# Patient Record
Sex: Male | Born: 1990 | Race: White | Hispanic: No | Marital: Single | Smoking: Current some day smoker
Health system: Southern US, Community
[De-identification: ages and names within clinical notes are randomized; demographics above are authoritative.]

## PROBLEM LIST (undated history)

## (undated) DIAGNOSIS — F909 Attention-deficit hyperactivity disorder, unspecified type: Secondary | ICD-10-CM

## (undated) DIAGNOSIS — K5010 Crohn's disease of large intestine without complications: Secondary | ICD-10-CM

## (undated) HISTORY — PX: WISDOM TOOTH EXTRACTION: SHX21

---

## 2016-09-17 ENCOUNTER — Ambulatory Visit (HOSPITAL_COMMUNITY)
Admission: EM | Admit: 2016-09-17 | Discharge: 2016-09-17 | Disposition: A | Discharge status: Home / Self Care | Payer: Self-pay

## 2016-09-17 ENCOUNTER — Encounter (HOSPITAL_COMMUNITY): Payer: Self-pay | Admitting: Emergency Medicine

## 2016-09-17 DIAGNOSIS — T63444A Toxic effect of venom of bees, undetermined, initial encounter: Secondary | ICD-10-CM

## 2016-09-17 HISTORY — DX: Attention-deficit hyperactivity disorder, unspecified type: F90.9

## 2016-09-17 NOTE — ED Provider Notes (Signed)
[facility]    CSN: [id number] Arrival date & time: 09/17/16  1226     History   Chief Complaint Chief Complaint  Patient presents with  . Insect Bite    HPI Izaak Sahr is a 26 y.o. male.   27 year old male states that he was stung twice by bees 2 days ago. One to the right upper arm and one to the left mid arm. Initially he had some itching and numbness to the mouth however he took Benadryl and that has since abated. The only other complaint is some drainage in the back of the throat. The chief complaint is that of erythema and mild itching surrounding the 2 sting sites.      Past Medical History:  Diagnosis Date  . ADHD     There are no active problems to display for this patient.   Past Surgical History:  Procedure Laterality Date  . WISDOM TOOTH EXTRACTION         Home Medications    Prior to Admission medications   Medication Sig Start Date End Date Taking? Authorizing Provider  diphenhydrAMINE (BENADRYL) 25 MG tablet Take 25 mg by mouth every 6 (six) hours as needed.   Yes [provider]  methylphenidate (RITALIN) 10 MG tablet Take 10 mg by mouth 2 (two) times daily with breakfast and lunch.   Yes [provider]    Family History History reviewed. No pertinent family history.  Social History Social History  Substance Use Topics  . Smoking status: Current Some Day Smoker    Types: Cigars  . Smokeless tobacco: Never Used  . Alcohol use Yes     Allergies   Wasp venom   Review of Systems Review of Systems  Constitutional: Negative.   HENT: Positive for postnasal drip.   Respiratory: Negative.  Negative for cough, shortness of breath, wheezing and stridor.   Cardiovascular: Negative for chest pain.  Gastrointestinal: Negative.   Skin:       As per history of present illness  Neurological: Negative.   All other systems reviewed and are negative.    Physical Exam Triage Vital Signs ED Triage Vitals    Enc Vitals Group     BP 09/17/16 1303 (!) 134/93     Pulse Rate 09/17/16 1303 82     Resp --      Temp 09/17/16 1303 (!) 96.7 F (35.9 C)     Temp Source 09/17/16 1303 Oral     SpO2 09/17/16 1303 99 %     Weight --      Height --      Head Circumference --      Peak Flow --      Pain Score 09/17/16 1305 3     Pain Loc --      Pain Edu? --      Excl. in GC? --    No data found.   Updated Vital Signs BP (!) 134/93 (BP Location: Left Wrist)   Pulse 82   Temp (!) 96.7 F (35.9 C) (Oral)   SpO2 99%   Visual Acuity Right Eye Distance:   Left Eye Distance:   Bilateral Distance:    Right Eye Near:   Left Eye Near:    Bilateral Near:     Physical Exam  Constitutional: He is oriented to person, place, and time. He appears well-developed and well-nourished. No distress.  HENT:  Head: Normocephalic and atraumatic.  Nose: Nose normal.  Mouth/Throat: Oropharynx is  clear and moist. No oropharyngeal exudate.  Eyes: EOM are normal.  Neck: Neck supple.  Cardiovascular: Normal rate, regular rhythm and normal heart sounds.   Pulmonary/Chest: Effort normal and breath sounds normal. No respiratory distress. He has no wheezes. He has no rales.  Musculoskeletal: He exhibits no edema.  Neurological: He is alert and oriented to person, place, and time.  Skin: Skin is warm and dry.  The right upper arm has a well circumscribed area of erythema which is slightly indurated and raised. No red streaks or signs of infection. No drainage. The left arm has a smaller area identical to the right located to the left elbow and portion of the upper arm. No lymphangitis. No circumferential edema.  Psychiatric: He has a normal mood and affect.  Nursing note and vitals reviewed.    UC Treatments / Results  Labs (all labs ordered are listed, but only abnormal results are displayed) Labs Reviewed - No data to display  EKG  EKG Interpretation None       Radiology No results  found.  Procedures Procedures (including critical care time)  Medications Ordered in UC Medications - No data to display   Initial Impression / Assessment and Plan / UC Course  I have reviewed the triage vital signs and the nursing notes.  Pertinent labs & imaging results that were available during my care of the patient were reviewed by me and considered in my medical decision making (see chart for details).    For the localized skin reaction to the bee sting apply cold compresses, hydrocortisone 1% cream 3-4 times a day and Benadryl cream or gel. At nighttime he can take Benadryl orally and during the day he could take Zyrtec or Allegra which are also antihistamines. For new symptoms or problems or worsening may return.    Final Clinical Impressions(s) / UC Diagnoses   Final diagnoses:  Bee sting reaction, undetermined intent, initial encounter    New Prescriptions New Prescriptions   No medications on file     Controlled Substance Prescriptions [place] Controlled Substance Registry consulted? Not Applicable   Hayden Rasmussen, NP 09/17/16 1438

## 2016-09-17 NOTE — Discharge Instructions (Signed)
For the localized skin reaction to the bee sting apply cold compresses, hydrocortisone 1% cream 3-4 times a day and Benadryl cream or gel. At nighttime he can take Benadryl orally and during the day he could take Zyrtec or Allegra which are also antihistamines. For new symptoms or problems or worsening may return.

## 2016-09-17 NOTE — ED Triage Notes (Addendum)
Pt was stung by two wasps on Wednesday.  One on his left elbow and one on his right upper arm.  Pt has redness, heat and swelling to both sites.  Pt reports allergy to wasps and had some swelling of the throat and tongue.  He took Benadryl and that swelling went away.  He reports no symptoms today except at the sting sites.

## 2016-10-11 ENCOUNTER — Encounter (HOSPITAL_COMMUNITY): Payer: Self-pay | Admitting: Emergency Medicine

## 2016-10-11 ENCOUNTER — Ambulatory Visit (HOSPITAL_COMMUNITY)
Admission: EM | Admit: 2016-10-11 | Discharge: 2016-10-11 | Disposition: A | Discharge status: Home / Self Care | Payer: Self-pay | Attending: Family Medicine | Admitting: Family Medicine

## 2016-10-11 DIAGNOSIS — J40 Bronchitis, not specified as acute or chronic: Secondary | ICD-10-CM

## 2016-10-11 DIAGNOSIS — J0100 Acute maxillary sinusitis, unspecified: Secondary | ICD-10-CM

## 2016-10-11 DIAGNOSIS — J069 Acute upper respiratory infection, unspecified: Secondary | ICD-10-CM

## 2016-10-11 MED ORDER — AMOXICILLIN 875 MG PO TABS: 875.0000 mg | ORAL_TABLET | Freq: Two times a day (BID) | ORAL | 0 refills | Status: AC

## 2016-10-11 MED ORDER — PREDNISONE 20 MG PO TABS: ORAL_TABLET | ORAL | 0 refills | Status: DC

## 2016-10-11 NOTE — ED Triage Notes (Signed)
PT reports cough, congestion, sinus pressure for 2 weeks.

## 2016-10-11 NOTE — ED Provider Notes (Signed)
  [facility]   [id number] 10/11/16 Arrival Time: 1016   SUBJECTIVE:  Jesse Yoder is a 26 y.o. male who presents to the urgent care with complaint of cough, congestion, sinus pressure for 2 weeks.   He works for an [employer]. He has a history of Crohn's disease.     Past Medical History:  Diagnosis Date  . ADHD    No family history on file. Social History   Social History  . Marital status: Single    Spouse name: N/A  . Number of children: N/A  . Years of education: N/A   Occupational History  . Not on file.   Social History Main Topics  . Smoking status: Current Some Day Smoker    Types: Cigars  . Smokeless tobacco: Never Used  . Alcohol use Yes  . Drug use: No  . Sexual activity: Not on file   Other Topics Concern  . Not on file   Social History Narrative  . No narrative on file   No outpatient prescriptions have been marked as taking for the 10/11/16 encounter [facility] Encounter).   Allergies  Allergen Reactions  . Wasp Venom Anaphylaxis      ROS: As per HPI, remainder of ROS negative.   OBJECTIVE:   Vitals:   10/11/16 1051  BP: 127/67  Pulse: 75  Resp: 16  Temp: 98.6 F (37 C)  TempSrc: Oral  SpO2: 98%  Weight: 175 lb (79.4 kg)  Height:  (1.702 m)     General appearance: alert; no distress Eyes: PERRL; EOMI; conjunctiva normal HENT: normocephalic; atraumatic; TMs normal, canal normal, external ears normal without trauma; nasal mucosa normal; oral mucosa normal Neck: supple Lungs: ronchi to auscultation bilaterally Heart: regular rate and rhythm Extremities: no cyanosis or edema; symmetrical with no gross deformities Skin: warm and dry Neurologic: normal gait; grossly normal Psychological: alert and cooperative; normal mood and affect      Labs:  No results found for this or any previous visit.  Labs Reviewed - No data to display  No results found.     ASSESSMENT & PLAN:  1.  Bronchitis   2. Upper respiratory tract infection, unspecified type   3. Acute maxillary sinusitis, recurrence not specified     Meds ordered this encounter  Medications  . amoxicillin (AMOXIL) 875 MG tablet    Sig: Take 1 tablet (875 mg total) by mouth 2 (two) times daily.    Dispense:  20 tablet    Refill:  0  . predniSONE (DELTASONE) 20 MG tablet    Sig: Two daily with food    Dispense:  10 tablet    Refill:  0    Reviewed expectations re: course of current medical issues. Questions answered. Outlined signs and symptoms indicating need for more acute intervention. Patient verbalized understanding. After Visit Summary given.    Procedures:      Elvina Sidle, MD 10/11/16 [phone]

## 2018-10-19 ENCOUNTER — Other Ambulatory Visit: Payer: Self-pay

## 2018-10-19 ENCOUNTER — Emergency Department (HOSPITAL_COMMUNITY): Payer: No Typology Code available for payment source

## 2018-10-19 ENCOUNTER — Emergency Department (HOSPITAL_COMMUNITY)
Admission: EM | Admit: 2018-10-19 | Discharge: 2018-10-19 | Disposition: A | Discharge status: Home / Self Care | Payer: No Typology Code available for payment source | Attending: Emergency Medicine | Admitting: Emergency Medicine

## 2018-10-19 ENCOUNTER — Encounter (HOSPITAL_COMMUNITY): Payer: Self-pay | Admitting: Emergency Medicine

## 2018-10-19 DIAGNOSIS — Y999 Unspecified external cause status: Secondary | ICD-10-CM | POA: Insufficient documentation

## 2018-10-19 DIAGNOSIS — F17210 Nicotine dependence, cigarettes, uncomplicated: Secondary | ICD-10-CM | POA: Insufficient documentation

## 2018-10-19 DIAGNOSIS — R51 Headache: Secondary | ICD-10-CM | POA: Diagnosis not present

## 2018-10-19 DIAGNOSIS — R0789 Other chest pain: Secondary | ICD-10-CM | POA: Insufficient documentation

## 2018-10-19 DIAGNOSIS — Y93I9 Activity, other involving external motion: Secondary | ICD-10-CM | POA: Diagnosis not present

## 2018-10-19 DIAGNOSIS — M25511 Pain in right shoulder: Secondary | ICD-10-CM | POA: Diagnosis not present

## 2018-10-19 DIAGNOSIS — Y92414 Local residential or business street as the place of occurrence of the external cause: Secondary | ICD-10-CM | POA: Insufficient documentation

## 2018-10-19 HISTORY — DX: Crohn's disease of large intestine without complications: K50.10

## 2018-10-19 MED ORDER — IBUPROFEN 400 MG PO TABS
600.0000 mg | ORAL_TABLET | Freq: Once | ORAL | Status: AC
  Administered 2018-10-19: 22:00:00 600 mg via ORAL
  Filled 2018-10-19: qty 1

## 2018-10-19 MED ORDER — CYCLOBENZAPRINE HCL 10 MG PO TABS: 10.0000 mg | ORAL_TABLET | Freq: Two times a day (BID) | ORAL | 0 refills | Status: AC | PRN

## 2018-10-19 NOTE — ED Notes (Signed)
Patient Alert and oriented to baseline. Stable and ambulatory to baseline. Patient verbalized understanding of the discharge instructions.  Patient belongings were taken by the patient.   
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
## 2018-10-19 NOTE — Discharge Instructions (Addendum)
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
## 2023-04-09 ENCOUNTER — Emergency Department (HOSPITAL_COMMUNITY): Payer: Self-pay

## 2023-04-09 ENCOUNTER — Ambulatory Visit (HOSPITAL_COMMUNITY)
Admission: EM | Admit: 2023-04-09 | Discharge: 2023-04-09 | Disposition: A | Discharge status: Home / Self Care | Payer: Self-pay

## 2023-04-09 ENCOUNTER — Emergency Department (HOSPITAL_COMMUNITY)
Admission: EM | Admit: 2023-04-09 | Discharge: 2023-04-09 | Disposition: A | Discharge status: Home / Self Care | Payer: Self-pay | Attending: Emergency Medicine | Admitting: Emergency Medicine

## 2023-04-09 ENCOUNTER — Other Ambulatory Visit: Payer: Self-pay

## 2023-04-09 ENCOUNTER — Encounter (HOSPITAL_COMMUNITY): Payer: Self-pay | Admitting: *Deleted

## 2023-04-09 DIAGNOSIS — R42 Dizziness and giddiness: Secondary | ICD-10-CM

## 2023-04-09 DIAGNOSIS — I10 Essential (primary) hypertension: Secondary | ICD-10-CM

## 2023-04-09 DIAGNOSIS — R079 Chest pain, unspecified: Secondary | ICD-10-CM | POA: Insufficient documentation

## 2023-04-09 LAB — BASIC METABOLIC PANEL
Anion gap: 15 (ref 5–15)
BUN: 11 mg/dL (ref 6–20)
CO2: 22 mmol/L (ref 22–32)
Calcium: 9.6 mg/dL (ref 8.9–10.3)
Chloride: 103 mmol/L (ref 98–111)
Creatinine, Ser: 1.09 mg/dL (ref 0.61–1.24)
GFR, Estimated: >60 mL/min (ref >60)
Glucose, Bld: 104 mg/dL — ABNORMAL HIGH (ref 70–99)
Potassium: 4.3 mmol/L (ref 3.5–5.1)
Sodium: 140 mmol/L (ref 135–145)

## 2023-04-09 LAB — CBC
HCT: 47.3 % (ref 39.0–52.0)
Hemoglobin: 16.1 g/dL (ref 13.0–17.0)
MCH: 29.7 pg (ref 26.0–34.0)
MCHC: 34 g/dL (ref 30.0–36.0)
MCV: 87.1 fL (ref 80.0–100.0)
Platelets: 345 10*3/uL (ref 150–400)
RBC: 5.43 MIL/uL (ref 4.22–5.81)
RDW: 12.4 % (ref 11.5–15.5)
WBC: 10.7 10*3/uL — ABNORMAL HIGH (ref 4.0–10.5)
nRBC: 0 % (ref 0.0–0.2)

## 2023-04-09 LAB — D-DIMER, QUANTITATIVE: D-Dimer, Quant: <0.27 ug{FEU}/mL (ref 0.00–0.50)

## 2023-04-09 LAB — TROPONIN I (HIGH SENSITIVITY)
Troponin I (High Sensitivity): <2 ng/L (ref <18)
Troponin I (High Sensitivity): <2 ng/L (ref <18)

## 2023-04-09 MED ORDER — HYDROXYZINE HCL 25 MG PO TABS: 25.0000 mg | ORAL_TABLET | Freq: Four times a day (QID) | ORAL | 0 refills | Status: DC

## 2023-04-09 MED ORDER — ALUM & MAG HYDROXIDE-SIMETH 200-200-20 MG/5ML PO SUSP
30.0000 mL | Freq: Once | ORAL | Status: AC
  Administered 2023-04-09: 30 mL via ORAL
  Filled 2023-04-09: qty 30

## 2023-04-09 NOTE — ED Notes (Signed)
 Pt states when he takes a deep breath his upper left chest feels tight and pressure. Pt says its been going on for a week but the last few days he hasn't been able to complete tasks without catching his breath.  [place] He denies hx cardiac and blood clot hx

## 2023-04-09 NOTE — ED Triage Notes (Signed)
 PT reports high BP at home 160/120 yesterday. Pt has felt dizzy ,

## 2023-04-09 NOTE — ED Notes (Signed)
Provider at bed side to eval PT 

## 2023-04-09 NOTE — ED Provider Notes (Signed)
 [facility]    CSN: [id number] Arrival date & time: 04/09/23  1615      History   Chief Complaint Chief Complaint  Patient presents with   Hypertension    HPI Jesse Yoder is a 33 y.o. male.   Patient presents with left-sided chest pain and shortness of breath that began last night.  Patient states that chest pain has been constant since last night.  Patient states that he has also had some intermittent dizziness and right arm numbness throughout the day today.  Patient states that over the last month he has noticed that his blood pressure has been elevated.  Patient denies taking any blood pressure medication regularly or history of diagnosed hypertension.  Patient states that he does have ADHD and is prescribed methylphenidate which he stopped abruptly on Tuesday.  Patient states that he stopped taking this because he thought it may be contributing to his high blood pressure.   Hypertension    Past Medical History:  Diagnosis Date   ADHD    Crohn's colitis (HCC)     There are no active problems to display for this patient.   Past Surgical History:  Procedure Laterality Date   WISDOM TOOTH EXTRACTION         Home Medications    Prior to Admission medications   Medication Sig Start Date End Date Taking? Authorizing Provider  methylphenidate (METADATE ER) 20 MG ER tablet Take 20 mg by mouth daily. New dose   Yes [provider]  traZODone (DESYREL) 50 MG tablet Take 50 mg by mouth at bedtime.   Yes [provider]  amoxicillin (AMOXIL) 875 MG tablet Take 1 tablet (875 mg total) by mouth 2 (two) times daily. 10/11/16   Elvina Sidle, MD  cyclobenzaprine (FLEXERIL) 10 MG tablet Take 1 tablet (10 mg total) by mouth 2 (two) times daily as needed for muscle spasms. 10/19/18   Robinson, [place] N, PA-C  methylphenidate (RITALIN) 10 MG tablet Take 10 mg by mouth 2 (two) times daily with breakfast and lunch.    [provider]  predniSONE (DELTASONE) 20 MG tablet Two daily with food 10/11/16   Elvina Sidle, MD    Family History History reviewed. No pertinent family history.  Social History Social History   Tobacco Use   Smoking status: Some Days    Types: Cigars   Smokeless tobacco: Never  Vaping Use   Vaping status: Never Used  Substance Use Topics   Alcohol use: Yes   Drug use: No     Allergies   Wasp venom   Review of Systems Review of Systems  Per HPI  Physical Exam Triage Vital Signs ED Triage Vitals  Encounter Vitals Group     BP 04/09/23 1635 (!) 143/106     Systolic BP Percentile --      Diastolic BP Percentile --      Pulse Rate 04/09/23 1635 (!) 106     Resp 04/09/23 1635 20     Temp 04/09/23 1635 98.4 F (36.9 C)     Temp src --      SpO2 04/09/23 1635 100 %     Weight --      Height --      Head Circumference --      Peak Flow --      Pain Score 04/09/23 1656 0     Pain Loc --      Pain Education --  Exclude from Growth Chart --    No data found.  Updated Vital Signs BP (!) 143/106   Pulse (!) 106   Temp 98.4 F (36.9 C)   Resp 20   SpO2 100%   Visual Acuity Right Eye Distance:   Left Eye Distance:   Bilateral Distance:    Right Eye Near:   Left Eye Near:    Bilateral Near:     Physical Exam Vitals and nursing note reviewed.  Constitutional:      General: He is awake. He is not in acute distress.    Appearance: Normal appearance. He is well-developed and well-groomed. He is not ill-appearing.  Cardiovascular:     Rate and Rhythm: Regular rhythm. Tachycardia present.  Pulmonary:     Effort: Pulmonary effort is normal.     Breath sounds: Normal breath sounds.  Musculoskeletal:        General: Normal range of motion.  Skin:    General: Skin is warm and dry.  Neurological:     General: No focal deficit present.     Mental Status: He is alert and oriented to person, place, and time. Mental status is at baseline.     GCS: GCS eye  subscore is 4. GCS verbal subscore is 5. GCS motor subscore is 6.  Psychiatric:        Behavior: Behavior is cooperative.      UC Treatments / Results  Labs (all labs ordered are listed, but only abnormal results are displayed) Labs Reviewed - No data to display  EKG   Radiology No results found.  Procedures Procedures (including critical care time)  Medications Ordered in UC Medications - No data to display  Initial Impression / Assessment and Plan / UC Course  I have reviewed the triage vital signs and the nursing notes.  Pertinent labs & imaging results that were available during my care of the patient were reviewed by me and considered in my medical decision making (see chart for details).     Mild tachycardia is present upon exam.  No other significant findings upon exam.  GCS 15.  EKG revealed normal sinus rhythm without ST elevation, depression, or acute cardiac findings.  Recommended that patient be seen in ER due to increased blood pressure and new onset of chest pain and dizziness.  Patient agreeable to plan at this time.  Patient is stable to arrive to the ER via POV. Final Clinical Impressions(s) / UC Diagnoses   Final diagnoses:  Hypertension, unspecified type  Chest pain, unspecified type  Dizziness     Discharge Instructions      Go to the ER for further evaluation of your chest pain and elevated blood pressure.   ED Prescriptions   None    PDMP not reviewed this encounter.   Wynonia Lawman A, NP 04/09/23 [phone]

## 2023-04-09 NOTE — Discharge Instructions (Signed)
 Go to the ER for further evaluation of your chest pain and elevated blood pressure.

## 2023-04-09 NOTE — Discharge Instructions (Addendum)
 All the blood work looked good and no signs of enlarged heart on x-ray.  No signs of heart attack or blood clot.  May be related to stress and not sleeping.  Try the hydroxyzine for the stress and you may need to increase your dose of trazadone.  You were given mental health resources and numbers for people who may be able to help you with that.  Also you will need to follow up if you continue to have pain.  If you start passing out, rapid heart rate pain is moving return to the ER.

## 2023-04-09 NOTE — ED Triage Notes (Signed)
 The pt has had chest pain and sob for 2-3 days    the pt has no previous history  he works with heavy equipment

## 2023-04-10 NOTE — ED Provider Notes (Signed)
 [place] [facility] Provider Note   CSN: [id number] Arrival date & time: 04/09/23  1703     History  Chief Complaint  Patient presents with   Chest Pain    Jesse Yoder is a 33 y.o. male.  Patient is a 33 year old male with a history of ADHD and difficulty sleeping who is taking trazodone at night who is presenting today with complaints of chest discomfort.  He reports that for the last week he has been noticing a tightness in his chest that initially was just noticeable but has been more uncomfortable in the last few days.  He reports the pain has not gone away at all in the last week but it gets worse when he tries to work.  It also feels tighter when he takes a deep breath.  He has not had a cough, congestion or fever.  Lying down does not seem to make his symptoms any worse.  He has not had any swelling in his lower extremities.  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  He does not smoke and has no history of asthma.  He has never used an inhaler.  He does report that he has been under a lot of stress lately and he is not sleeping.  He has not had any reflux symptoms.  Also patient went to urgent care prior to this describing his symptoms.  There he was found to be hypertensive without history of hypertension but reports when he has been checking at home his blood pressure has been running high for the last week or so.  The history is provided by the patient and medical records.  Chest Pain      Home Medications Prior to Admission medications   Medication Sig Start Date End Date Taking? Authorizing Provider  hydrOXYzine (ATARAX) 25 MG tablet Take 1 tablet (25 mg total) by mouth every 6 (six) hours. 04/09/23  Yes Gwyneth Sprout, MD  amoxicillin (AMOXIL) 875 MG tablet Take 1 tablet (875 mg total) by mouth 2 (two) times daily. 10/11/16   Elvina Sidle, MD  cyclobenzaprine  (FLEXERIL) 10 MG tablet Take 1 tablet (10 mg total) by mouth 2 (two) times daily as needed for muscle spasms. 10/19/18   Robinson, [place] N, PA-C  methylphenidate (METADATE ER) 20 MG ER tablet Take 20 mg by mouth daily. New dose    [provider]  methylphenidate (RITALIN) 10 MG tablet Take 10 mg by mouth 2 (two) times daily with breakfast and lunch.    [provider]  predniSONE (DELTASONE) 20 MG tablet Two daily with food 10/11/16   Elvina Sidle, MD  traZODone (DESYREL) 50 MG tablet Take 50 mg by mouth at bedtime.    [provider]      Allergies    Wasp venom    Review of Systems   Review of Systems  Cardiovascular:  Positive for chest pain.    Physical Exam Updated Vital Signs BP 131/85 (BP Location: Right Arm)   Pulse 83   Temp 98.7 F (37.1 C) (Oral)   Resp 18   Ht 5\' 7"  (1.702 m)   Wt 86.2 kg   SpO2 97%   BMI 29.76 kg/m  Physical Exam Vitals and nursing note reviewed.  Constitutional:      General: He is not in acute distress.    Appearance: He is well-developed.  HENT:     Head:  Normocephalic and atraumatic.  Eyes:     Conjunctiva/sclera: Conjunctivae normal.     Pupils: Pupils are equal, round, and reactive to light.  Cardiovascular:     Rate and Rhythm: Normal rate and regular rhythm.     Heart sounds: No murmur heard. Pulmonary:     Effort: Pulmonary effort is normal. No respiratory distress.     Breath sounds: Normal breath sounds. No wheezing or rales.  Chest:     Chest wall: No tenderness.  Abdominal:     General: There is no distension.     Palpations: Abdomen is soft.     Tenderness: There is no abdominal tenderness. There is no guarding or rebound.  Musculoskeletal:        General: No tenderness. Normal range of motion.     Cervical back: Normal range of motion and neck supple.     Right lower leg: No edema.     Left lower leg: No edema.  Skin:    General: Skin is warm and dry.     Findings: No erythema or rash.   Neurological:     Mental Status: He is alert and oriented to person, place, and time. Mental status is at baseline.  Psychiatric:        Mood and Affect: Mood normal.        Behavior: Behavior normal.     ED Results / Procedures / Treatments   Labs (all labs ordered are listed, but only abnormal results are displayed) Labs Reviewed  BASIC METABOLIC PANEL - Abnormal; Notable for the following components:      Result Value   Glucose, Bld 104 (*)    All other components within normal limits  CBC - Abnormal; Notable for the following components:   WBC 10.7 (*)    All other components within normal limits  D-DIMER, QUANTITATIVE  TROPONIN I (HIGH SENSITIVITY)  TROPONIN I (HIGH SENSITIVITY)    EKG EKG Interpretation Date/Time:  Saturday April 09 2023 20:27:58 EDT Ventricular Rate:  77 PR Interval:  126 QRS Duration:  82 QT Interval:  378 QTC Calculation: 427 R Axis:   80  Text Interpretation: Normal sinus rhythm Cannot rule out Anterior infarct , age undetermined No significant change since last tracing When compared with ECG of 09-Apr-2023 17:13, PREVIOUS ECG IS PRESENT Confirmed by Gwyneth Sprout ([id number]) on 04/09/2023 9:00:04 PM  Radiology DG Chest 2 View Result Date: 04/09/2023 CLINICAL DATA:  Chest pain shortness of breath for 2 days. EXAM: CHEST - 2 VIEW COMPARISON:  10/19/2018. FINDINGS: Normal heart, mediastinum and hila. Lungs are clear.  No pleural effusion or pneumothorax. Skeletal structures are within normal limits. IMPRESSION: Normal chest radiographs. Electronically Signed   By: Amie Portland M.D.   On: 04/09/2023 17:52    Procedures Procedures    Medications Ordered in ED Medications  alum & mag hydroxide-simeth (MAALOX/MYLANTA) 200-200-20 MG/5ML suspension 30 mL (30 mLs Oral Given 04/09/23 2232)    ED Course/ Medical Decision Making/ A&P                                 Medical Decision Making Amount and/or Complexity of Data Reviewed External Data  Reviewed: notes. Labs: ordered. Decision-making details documented in ED Course. Radiology: ordered and independent interpretation performed. Decision-making details documented in ED Course. ECG/medicine tests: ordered and independent interpretation performed. Decision-making details documented in ED Course.  Risk OTC drugs. Prescription drug management.  Pt presenting today with a complaint that caries a high risk for morbidity and mortality.  Here today with complaint of nonspecific chest tightness.  Reports it is worse with exertion.  No wheezing on exam.  Initially reported at urgent care that his blood pressure was high however without intervention blood pressure has been normal here.  Slightly tachycardic prior to arrival but with rest tachycardia has also resolved.  Patient reports a family history of blood clots and multiple family members but no significant cardiac history.  He does not use drugs, excessive alcohol or tobacco.  Will rule out PE, ACS, symptoms are not classic for pneumonia, myocarditis, pericardial effusion/tamponade.  Patient has pulses in all extremities and low suspicion for dissection at this time.  Patient does report being under an extreme amount of stress lately and is not sleeping.  He has stopped taking his Adderall but continues to try taking his trazodone but reports he still not sleeping. I independently interpreted patient's labs and EKG.  EKG without acute findings today.  No ST elevation concerning for MI.  D-dimer, troponin x 2, BMP are all within normal limits.  I have independently visualized and interpreted pt's images today. Chest x-ray within normal limits.  Findings discussed with the patient.  At this time patient does not have a PCP and TOC was consulted to contact the patient to help with PCP as he does not have insurance.  He was also given mental health resources to follow-up about the stress and sleep.  He currently is just getting prescriptions over  the phone.  He was given a prescription for hydroxyzine.  He was also given return precautions.         Final Clinical Impression(s) / ED Diagnoses Final diagnoses:  Nonspecific chest pain    Rx / DC Orders ED Discharge Orders          Ordered    hydrOXYzine (ATARAX) 25 MG tablet  Every 6 hours        04/09/23 2306              Gwyneth Sprout, MD 04/10/23 [phone]

## 2023-08-07 ENCOUNTER — Encounter (HOSPITAL_COMMUNITY): Payer: Self-pay

## 2023-08-07 ENCOUNTER — Emergency Department (HOSPITAL_COMMUNITY)
Admission: EM | Admit: 2023-08-07 | Discharge: 2023-08-07 | Disposition: A | Discharge status: Home / Self Care | Payer: Self-pay | Attending: Emergency Medicine | Admitting: Emergency Medicine

## 2023-08-07 ENCOUNTER — Other Ambulatory Visit: Payer: Self-pay

## 2023-08-07 DIAGNOSIS — T782XXA Anaphylactic shock, unspecified, initial encounter: Secondary | ICD-10-CM | POA: Insufficient documentation

## 2023-08-07 DIAGNOSIS — T63441A Toxic effect of venom of bees, accidental (unintentional), initial encounter: Secondary | ICD-10-CM | POA: Insufficient documentation

## 2023-08-07 DIAGNOSIS — F17290 Nicotine dependence, other tobacco product, uncomplicated: Secondary | ICD-10-CM | POA: Insufficient documentation

## 2023-08-07 LAB — CBC WITH DIFFERENTIAL/PLATELET
Abs Immature Granulocytes: 0.01 K/uL (ref 0.00–0.07)
Basophils Absolute: 0.1 K/uL (ref 0.0–0.1)
Basophils Relative: 1 %
Eosinophils Absolute: 0.3 K/uL (ref 0.0–0.5)
Eosinophils Relative: 4 %
HCT: 48.5 % (ref 39.0–52.0)
Hemoglobin: 16.4 g/dL (ref 13.0–17.0)
Immature Granulocytes: 0 %
Lymphocytes Relative: 47 %
Lymphs Abs: 3.3 K/uL (ref 0.7–4.0)
MCH: 30.3 pg (ref 26.0–34.0)
MCHC: 33.8 g/dL (ref 30.0–36.0)
MCV: 89.5 fL (ref 80.0–100.0)
Monocytes Absolute: 0.5 K/uL (ref 0.1–1.0)
Monocytes Relative: 7 %
Neutro Abs: 3 K/uL (ref 1.7–7.7)
Neutrophils Relative %: 41 %
Platelets: 349 K/uL (ref 150–400)
RBC: 5.42 MIL/uL (ref 4.22–5.81)
RDW: 13.2 % (ref 11.5–15.5)
WBC: 7.2 K/uL (ref 4.0–10.5)
nRBC: 0 % (ref 0.0–0.2)

## 2023-08-07 LAB — BASIC METABOLIC PANEL WITH GFR
Anion gap: 10 (ref 5–15)
BUN: 16 mg/dL (ref 6–20)
CO2: 23 mmol/L (ref 22–32)
Calcium: 9.1 mg/dL (ref 8.9–10.3)
Chloride: 106 mmol/L (ref 98–111)
Creatinine, Ser: 1.1 mg/dL (ref 0.61–1.24)
GFR, Estimated: >60 mL/min (ref >60)
Glucose, Bld: 153 mg/dL — ABNORMAL HIGH (ref 70–99)
Potassium: 3.7 mmol/L (ref 3.5–5.1)
Sodium: 139 mmol/L (ref 135–145)

## 2023-08-07 MED ORDER — DIPHENHYDRAMINE HCL 50 MG/ML IJ SOLN
25.0000 mg | Freq: Once | INTRAMUSCULAR | Status: AC
  Administered 2023-08-07: 25 mg via INTRAVENOUS
  Filled 2023-08-07: qty 1

## 2023-08-07 MED ORDER — PREDNISONE 10 MG (21) PO TBPK: ORAL_TABLET | Freq: Every day | ORAL | 0 refills | Status: AC

## 2023-08-07 MED ORDER — EPINEPHRINE 0.3 MG/0.3ML IJ SOAJ: 0.3000 mg | INTRAMUSCULAR | 0 refills | Status: AC | PRN

## 2023-08-07 MED ORDER — EPINEPHRINE 0.3 MG/0.3ML IJ SOAJ: 0.3000 mg | Freq: Once | INTRAMUSCULAR | Status: AC

## 2023-08-07 MED ORDER — EPINEPHRINE 0.3 MG/0.3ML IJ SOAJ
INTRAMUSCULAR | Status: AC
  Administered 2023-08-07: 0.3 mg via INTRAMUSCULAR
  Filled 2023-08-07: qty 0.3

## 2023-08-07 MED ORDER — FAMOTIDINE IN NACL 20-0.9 MG/50ML-% IV SOLN
20.0000 mg | Freq: Once | INTRAVENOUS | Status: AC
  Administered 2023-08-07: 20 mg via INTRAVENOUS
  Filled 2023-08-07: qty 50

## 2023-08-07 MED ORDER — CETIRIZINE HCL 10 MG PO TABS: 10.0000 mg | ORAL_TABLET | Freq: Every day | ORAL | 0 refills | Status: AC

## 2023-08-07 MED ORDER — METHYLPREDNISOLONE SODIUM SUCC 125 MG IJ SOLR
125.0000 mg | Freq: Once | INTRAMUSCULAR | Status: AC
  Administered 2023-08-07: 125 mg via INTRAVENOUS
  Filled 2023-08-07: qty 2

## 2023-08-07 MED ORDER — SODIUM CHLORIDE 0.9 % IV BOLUS
1000.0000 mL | Freq: Once | INTRAVENOUS | Status: AC
  Administered 2023-08-07: 1000 mL via INTRAVENOUS

## 2023-08-07 NOTE — ED Triage Notes (Signed)
 Patient was stuck by a bee on his left ankle. He said his palms started to itch, testicles began itching, lips swelling, ears swelling, face swelling. Hives all over body. Dr. Elnor at bedside.

## 2023-08-07 NOTE — ED Provider Notes (Signed)
 [place] EMERGENCY DEPARTMENT AT [facility] Provider Note  CSN: [id number] Arrival date & time: 08/07/23 9050  Chief Complaint(s) Allergic Reaction  HPI Jesse Yoder is a 33 y.o. male with past medical history as below, significant for ADHD, colitis who presents to the ED with complaint of bee sting  Patient reports just prior to arrival he was stung by some sort of insect on the ground.  He experienced diffuse hives, facial swelling, difficulty breathing and swallowing, took 25 mg benadryl  pta.   He denies any known allergies.  Denies recent medication or diet changes.  Reports state of health prior to onset of symptoms   Past Medical History Past Medical History:  Diagnosis Date   ADHD    Crohn's colitis (HCC)    There are no active problems to display for this patient.  Home Medication(s) Prior to Admission medications   Medication Sig Start Date End Date Taking? Authorizing Provider  amphetamine-dextroamphetamine (ADDERALL XR) 20 MG 24 hr capsule Take 20 mg by mouth daily.   Yes [provider]  amphetamine-dextroamphetamine (ADDERALL) 10 MG tablet Take 10 mg by mouth daily with breakfast.   Yes [provider]  cetirizine  (ZYRTEC  ALLERGY) 10 MG tablet Take 1 tablet (10 mg total) by mouth daily for 14 days. 08/07/23 08/21/23 Yes Elnor Savant A, DO  EPINEPHrine  0.3 mg/0.3 mL IJ SOAJ injection Inject 0.3 mg into the muscle as needed for anaphylaxis. 08/07/23  Yes Elnor Savant LABOR, DO  predniSONE  (STERAPRED UNI-PAK 21 TAB) 10 MG (21) TBPK tablet Take by mouth daily. Take 6 tabs by mouth daily  for 2 days, then 5 tabs for 2 days, then 4 tabs for 2 days, then 3 tabs for 2 days, 2 tabs for 2 days, then 1 tab by mouth daily for 2 days 08/07/23  Yes Elnor Savant A, DO  traZODone (DESYREL) 50 MG tablet Take 50 mg by mouth at bedtime.   Yes [provider]  amoxicillin  (AMOXIL ) 875 MG tablet Take 1 tablet (875 mg total) by mouth 2 (two) times  daily. Patient not taking: Reported on 08/07/2023 10/11/16   Mario Million, MD  cyclobenzaprine  (FLEXERIL ) 10 MG tablet Take 1 tablet (10 mg total) by mouth 2 (two) times daily as needed for muscle spasms. Patient not taking: Reported on 08/07/2023 10/19/18   Robinson, [place] N, PA-C  methylphenidate (METADATE ER) 20 MG ER tablet Take 20 mg by mouth daily. New dose Patient not taking: Reported on 08/07/2023    [provider]  methylphenidate (RITALIN) 10 MG tablet Take 10 mg by mouth 2 (two) times daily with breakfast and lunch. Patient not taking: Reported on 08/07/2023    [provider]                                                                                                                                    Past Surgical History Past Surgical History:  Procedure Laterality Date   WISDOM TOOTH EXTRACTION     Family History History reviewed. No pertinent family history.  Social History Social History   Tobacco Use   Smoking status: Some Days    Types: Cigars   Smokeless tobacco: Never  Vaping Use   Vaping status: Never Used  Substance Use Topics   Alcohol use: Yes   Drug use: No   Allergies Wasp venom, Bee venom, and Hops oil  Review of Systems A thorough review of systems was obtained and all systems are negative except as noted in the HPI and PMH.   Physical Exam Vital Signs  I have reviewed the triage vital signs BP 125/77   Pulse 82   Temp 98 F (36.7 C) (Oral)   Resp (!) 21   Ht 5' 7 (1.702 m)   Wt 88 kg   SpO2 97%   BMI 30.39 kg/m  Physical Exam Vitals and nursing note reviewed.  Constitutional:      General: He is in acute distress.     Appearance: He is well-developed.  HENT:     Head: Normocephalic and atraumatic.     Jaw: There is normal jaw occlusion.     Comments: Facial swelling noted     Right Ear: External ear normal.     Left Ear: External ear normal.     Mouth/Throat:     Mouth: Mucous membranes are moist.      Pharynx: Oropharynx is clear. Uvula midline.     Comments: No drooling stridor or trismus Eyes:     General: No scleral icterus. Cardiovascular:     Rate and Rhythm: Normal rate and regular rhythm.     Pulses: Normal pulses.     Heart sounds: Normal heart sounds.  Pulmonary:     Effort: Pulmonary effort is normal. No respiratory distress.     Breath sounds: Normal breath sounds.  Abdominal:     General: Abdomen is flat.     Palpations: Abdomen is soft.     Tenderness: There is no abdominal tenderness.  Musculoskeletal:     Cervical back: No rigidity.     Right lower leg: No edema.     Left lower leg: No edema.  Skin:    General: Skin is warm and dry.     Capillary Refill: Capillary refill takes less than 2 seconds.     Comments: Diffuse urticaria  Neurological:     Mental Status: He is alert.  Psychiatric:        Mood and Affect: Mood normal.        Behavior: Behavior normal.     ED Results and Treatments Labs (all labs ordered are listed, but only abnormal results are displayed) Labs Reviewed  BASIC METABOLIC PANEL WITH GFR - Abnormal; Notable for the following components:      Result Value   Glucose, Bld 153 (*)    All other components within normal limits  CBC WITH DIFFERENTIAL/PLATELET  Radiology No results found.  Pertinent labs & imaging results that were available during my care of the patient were reviewed by me and considered in my medical decision making (see MDM for details).  Medications Ordered in ED Medications  sodium chloride  0.9 % bolus 1,000 mL (0 mLs Intravenous Stopped 08/07/23 1205)  diphenhydrAMINE  (BENADRYL ) injection 25 mg (25 mg Intravenous Given 08/07/23 1017)  famotidine  (PEPCID ) IVPB 20 mg premix (0 mg Intravenous Stopped 08/07/23 1205)  methylPREDNISolone  sodium succinate (SOLU-MEDROL ) 125 mg/2 mL injection 125 mg (125 mg  Intravenous Given 08/07/23 1017)  EPINEPHrine  (EPI-PEN) injection 0.3 mg (0.3 mg Intramuscular Given 08/07/23 1007)                                                                                                                                     Procedures .Critical Care  Performed by: Elnor Jayson LABOR, DO Authorized by: Elnor Jayson LABOR, DO   Critical care provider statement:    Critical care time (minutes):  30   Critical care time was exclusive of:  Separately billable procedures and treating other patients   Critical care was necessary to treat or prevent imminent or life-threatening deterioration of the following conditions: anaphylaxis.   Critical care was time spent personally by me on the following activities:  Development of treatment plan with patient or surrogate, discussions with consultants, evaluation of patient's response to treatment, examination of patient, ordering and review of laboratory studies, ordering and review of radiographic studies, ordering and performing treatments and interventions, pulse oximetry, re-evaluation of patient's condition, review of old charts and obtaining history from patient or surrogate   (including critical care time)  Medical Decision Making / ED Course    Medical Decision Making:    Jesse Yoder is a 33 y.o. male with past medical history as below, significant for ADHD, colitis who presents to the ED with complaint of bee sting. The complaint involves an extensive differential diagnosis and also carries with it a high risk of complications and morbidity.  Serious etiology was considered. Ddx includes but is not limited to: Anaphylaxis, contact dermatitis, irritant dermatitis, angioedema, etc.  Complete initial physical exam performed, notably the patient was in distress, appearance of anaphylaxis.    Reviewed and confirmed nursing documentation for past medical history, family history, social history.  Vital signs reviewed.     Anaphylaxis> - Diffuse urticaria, facial swelling, chest tightness, dysphagia  - HDS, no hypoxia - Administer intramuscular epinephrine , give 25 mg Benadryl  as he took some benadryl  pta, give Pepcid , give Solu-Medrol , give IV fluids.  Will check CBC BMP - Labs are stable, HDS, he is feeling much better, tolerant p.o. without difficulty.  Urticaria has resolved.  - He was observed in the emergency department for appropriate medical time following epinephrine , feeling back to baseline, stable for DC - Started on steroids, antihistamine, given EpiPen  for home, follow-up with allergy clinic. Strict Return precautions  Clinical Course as of 08/07/23 1400  Sun Aug 07, 2023  1036 Symptoms improving, urticaria improved, facial swelling improved.  Blood pressure stable, no hypoxia.  Spoke w/ family at bedside [SG]    Clinical Course User Index [SG] Elnor Jayson LABOR, DO       Patient in no distress and overall condition is stable. Detailed discussions were had with the patient/guardian regarding current findings, and need for close f/u with PCP or on call doctor. The patient/guardian has been instructed to return immediately if the symptoms worsen in any way for re-evaluation. Patient/guardian verbalized understanding and is in agreement with current care plan. All questions answered prior to discharge.              Additional history obtained: -Additional history obtained from family -External records from outside source obtained and reviewed including: Chart review including previous notes, labs, imaging, consultation notes including  allergies   Lab Tests: -I ordered, reviewed, and interpreted labs.   The pertinent results include:   Labs Reviewed  BASIC METABOLIC PANEL WITH GFR - Abnormal; Notable for the following components:      Result Value   Glucose, Bld 153 (*)    All other components within normal limits  CBC WITH DIFFERENTIAL/PLATELET    Notable for labs  stable  EKG   EKG Interpretation Date/Time:    Ventricular Rate:    PR Interval:    QRS Duration:    QT Interval:    QTC Calculation:   R Axis:      Text Interpretation:           Imaging Studies ordered: na   Medicines ordered and prescription drug management: Meds ordered this encounter  Medications   EPINEPHrine  (EPI-PEN) 0.3 mg/0.3 mL injection    Hampton, Tatiana L: cabinet override   sodium chloride  0.9 % bolus 1,000 mL   diphenhydrAMINE  (BENADRYL ) injection 25 mg   famotidine  (PEPCID ) IVPB 20 mg premix   methylPREDNISolone  sodium succinate (SOLU-MEDROL ) 125 mg/2 mL injection 125 mg   EPINEPHrine  (EPI-PEN) injection 0.3 mg   EPINEPHrine  0.3 mg/0.3 mL IJ SOAJ injection    Sig: Inject 0.3 mg into the muscle as needed for anaphylaxis.    Dispense:  1 each    Refill:  0   cetirizine  (ZYRTEC  ALLERGY) 10 MG tablet    Sig: Take 1 tablet (10 mg total) by mouth daily for 14 days.    Dispense:  14 tablet    Refill:  0   predniSONE  (STERAPRED UNI-PAK 21 TAB) 10 MG (21) TBPK tablet    Sig: Take by mouth daily. Take 6 tabs by mouth daily  for 2 days, then 5 tabs for 2 days, then 4 tabs for 2 days, then 3 tabs for 2 days, 2 tabs for 2 days, then 1 tab by mouth daily for 2 days    Dispense:  42 tablet    Refill:  0    -I have reviewed the patients home medicines and have made adjustments as needed   Consultations Obtained: na   Cardiac Monitoring: The patient was maintained on a cardiac monitor.  I personally viewed and interpreted the cardiac monitored which showed an underlying rhythm of: sinus tachy > nsr Continuous pulse oximetry interpreted by myself, 100% on ra.    Social Determinants of Health:  Diagnosis or treatment significantly limited by social determinants of health: current smoker   Reevaluation: After the interventions noted above, I reevaluated the patient and found that they have  improved  Co morbidities that complicate the patient  evaluation  Past Medical History:  Diagnosis Date   ADHD    Crohn's colitis (HCC)       Dispostion: Disposition decision including need for hospitalization was considered, and patient discharged from emergency department.    Final Clinical Impression(s) / ED Diagnoses Final diagnoses:  Anaphylaxis, initial encounter        Elnor Jayson LABOR, DO 08/07/23 1400

## 2023-08-07 NOTE — Discharge Instructions (Addendum)
 Please follow-up with your primary doctor(s) within 2-3 days. Please avoid any known triggers of your allergies.  We have given you a prescription for an Epi-Pen. Please pick it up as soon as possible. Always carry this with you. Only use the Epi-Pen in the event of a severe allergic reaction with trouble breathing or throat swelling. You must go to the hospital right away if you ever use the Epi-Pen. Remember that they expire every year so you should have your doctor write a new prescription yearly. We have also given you a prescription for prednisone and an antihistamine. Please pick it up as soon as possible and use as directed.    Please return to the Emergency Department right away if you have any worsening or new shortness of breath, changes in your voice, tightness/itching in your mouth/throat, swelling, severe hives, chest pain, high fever. There is a very small chance of a recurrence of the allergic reaction, typically in the next 24 hours. If you see the same symptoms (rash, trouble breathing, vomiting, etc) return, come back to the Emergency Department immediately.   Please return to the emergency department immediately for any new or concerning symptoms, or if you get worse.
